# Patient Record
Sex: Female | Born: 2000 | Race: White | Hispanic: No | Marital: Single | Smoking: Never smoker
Health system: Southern US, Community
[De-identification: ages and names within clinical notes are randomized; demographics above are authoritative.]

## PROBLEM LIST (undated history)

## (undated) DIAGNOSIS — N946 Dysmenorrhea, unspecified: Secondary | ICD-10-CM

## (undated) HISTORY — PX: WISDOM TOOTH EXTRACTION: SHX21

## (undated) HISTORY — DX: Dysmenorrhea, unspecified: N94.6

---
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
## 2019-02-15 ENCOUNTER — Encounter: Payer: Self-pay | Admitting: Obstetrics and Gynecology

## 2019-04-11 ENCOUNTER — Telehealth: Payer: Self-pay | Admitting: Obstetrics and Gynecology

## 2019-04-11 ENCOUNTER — Other Ambulatory Visit: Payer: Self-pay

## 2019-04-11 ENCOUNTER — Encounter: Payer: Self-pay | Admitting: Obstetrics and Gynecology

## 2019-04-11 ENCOUNTER — Ambulatory Visit: Payer: BC Managed Care – PPO | Admitting: Obstetrics and Gynecology

## 2019-04-11 VITALS — BP 110/60 | HR 76 | Temp 98.1°F | Resp 16 | Ht 61.0 in | Wt 115.0 lb

## 2019-04-11 DIAGNOSIS — N946 Dysmenorrhea, unspecified: Secondary | ICD-10-CM | POA: Diagnosis not present

## 2019-04-11 MED ORDER — DROSPIRENONE-ETHINYL ESTRADIOL 3-0.03 MG PO TABS: 1.0000 | ORAL_TABLET | Freq: Every day | ORAL | 0 refills | Status: DC

## 2019-04-11 MED ORDER — IBUPROFEN 800 MG PO TABS: 800.0000 mg | ORAL_TABLET | Freq: Three times a day (TID) | ORAL | 5 refills | Status: AC | PRN

## 2019-04-11 NOTE — Telephone Encounter (Signed)
Patient needs a 3 month pill check appointment with Dr.Silva. She asked if she could have a  virtual appointment for this follow up?

## 2019-04-11 NOTE — Patient Instructions (Signed)
Drospirenone; Ethinyl Estradiol tablets What is this medicine? DROSPIRENONE; ETHINYL ESTRADIOL (dro SPY re nown; ETH in il es tra DYE ole) is an oral contraceptive (birth control pill). This medicine combines two types of female hormones, an estrogen and a progestin. It is used to prevent ovulation and pregnancy. This medicine may be used for other purposes; ask your health care provider or pharmacist if you have questions. COMMON BRAND NAME(S): Gianvi, Jasmiel, Lo-Zumandimine, Loryna, Nikki 28-Day, Ocella, Syeda, Vestura, Yasmin, Yaz, Zarah, Zumandimine What should I tell my health care provider before I take this medicine? They need to know if you have or ever had any of these conditions:  abnormal vaginal bleeding  adrenal gland disease  blood vessel disease or blood clots  breast, cervical, endometrial, ovarian, liver, or uterine cancer  diabetes  gallbladder disease  heart disease or recent heart attack  high blood pressure  high cholesterol  high potassium level  kidney disease  liver disease  migraine headaches  stroke  systemic lupus erythematosus (SLE)  tobacco smoker  an unusual or allergic reaction to estrogens, progestins, or other medicines, foods, dyes, or preservatives  pregnant or trying to get pregnant  breast-feeding How should I use this medicine? Take this medicine by mouth. To reduce nausea, this medicine may be taken with food. Follow the directions on the prescription label. Take this medicine at the same time each day and in the order directed on the package. Do not take your medicine more often than directed. A patient package insert for the product will be given with each prescription and refill. Read this sheet carefully each time. The sheet may change frequently. Talk to your pediatrician regarding the use of this medicine in children. Special care may be needed. This medicine has been used in female children who have started having  menstrual periods. Overdosage: If you think you have taken too much of this medicine contact a poison control center or emergency room at once. NOTE: This medicine is only for you. Do not share this medicine with others. What if I miss a dose? If you miss a dose, refer to the patient information sheet you received with your medicine for direction. If you miss more than one pill, this medicine may not be as effective and you may need to use another form of birth control. What may interact with this medicine? Do not take this medicine with any of the following medications:  aminoglutethimide  amprenavir, fosamprenavir  atazanavir; cobicistat  anastrozole  bosentan  exemestane  letrozole  metyrapone  testolactone This medicine may also interact with the following medications:  acetaminophen  antiviral medicines for HIV or AIDS  aprepitant  barbiturates  certain antibiotics like rifampin, rifabutin, rifapentine, and possibly penicillins or tetracyclines  certain diuretics like amiloride, spironolactone, triamterene  certain medicines for fungal infections like griseofulvin, ketoconazole, itraconazole  certain medications for high blood pressure or heart conditions like ACE-inhibitors, Angiotensin-II receptor blockers, eplerenone  certain medicines for seizures like carbamazepine, oxcarbazepine, phenobarbital, phenytoin  cholestyramine  cobicistat  corticosteroid like hydrocortisone and prednisolone  cyclosporine  dantrolene  felbamate  grapefruit juice  heparin  lamotrigine  medicines for diabetes, including pioglitazone  modafinil  NSAIDs  potassium supplements  pyrimethamine  raloxifene  St. John's wort  sulfasalazine  tamoxifen  topiramate  thyroid hormones  warfarin his list may not describe all possible interactions. Give your health care provider a list of all the medicines, herbs, non-prescription drugs, or dietary supplements  you use. Also tell them   if you smoke, drink alcohol, or use illegal drugs. Some items may interact with your medicine. This list may not describe all possible interactions. Give your health care provider a list of all the medicines, herbs, non-prescription drugs, or dietary supplements you use. Also tell them if you smoke, drink alcohol, or use illegal drugs. Some items may interact with your medicine. What should I watch for while using this medicine? Visit your doctor or health care professional for regular checks on your progress. You will need a regular breast and pelvic exam and Pap smear while on this medicine. Use an additional method of contraception during the first cycle that you take these tablets. If you have any reason to think you are pregnant, stop taking this medicine right away and contact your doctor or health care professional. If you are taking this medicine for hormone related problems, it may take several cycles of use to see improvement in your condition. Smoking increases the risk of getting a blood clot or having a stroke while you are taking birth control pills, especially if you are more than 19 years old. You are strongly advised not to smoke. This medicine can make your body retain fluid, making your fingers, hands, or ankles swell. Your blood pressure can go up. Contact your doctor or health care professional if you feel you are retaining fluid. This medicine can make you more sensitive to the sun. Keep out of the sun. If you cannot avoid being in the sun, wear protective clothing and use sunscreen. Do not use sun lamps or tanning beds/booths. If you wear contact lenses and notice visual changes, or if the lenses begin to feel uncomfortable, consult your eye care specialist. In some women, tenderness, swelling, or minor bleeding of the gums may occur. Notify your dentist if this happens. Brushing and flossing your teeth regularly may help limit this. See your dentist  regularly and inform your dentist of the medicines you are taking. If you are going to have elective surgery, you may need to stop taking this medicine before the surgery. Consult your health care professional for advice. This medicine does not protect you against HIV infection (AIDS) or any other sexually transmitted diseases. What side effects may I notice from receiving this medicine? Side effects that you should report to your doctor or health care professional as soon as possible:  allergic reactions like skin rash, itching or hives, swelling of the face, lips, or tongue  breast tissue changes or discharge  changes in vision  chest pain  confusion, trouble speaking or understanding  dark urine  general ill feeling or flu-like symptoms  light-colored stools  nausea, vomiting  pain, swelling, warmth in the leg  right upper belly pain  severe headaches  shortness of breath  sudden numbness or weakness of the face, arm or leg  trouble walking, dizziness, loss of balance or coordination  unusual vaginal bleeding  yellowing of the eyes or skin Side effects that usually do not require medical attention (report to your doctor or health care professional if they continue or are bothersome):  acne  brown spots on the face  change in appetite  change in sexual desire  depressed mood or mood swings  fluid retention and swelling  stomach cramps or bloating  unusually weak or tired  weight gain This list may not describe all possible side effects. Call your doctor for medical advice about side effects. You may report side effects to FDA at 1-800-FDA-1088. Where should I  keep my medicine? Keep out of the reach of children. Store at room temperature between 15 and 30 degrees C (59 and 86 degrees F). Throw away any unused medicine after the expiration date. NOTE: This sheet is a summary. It may not cover all possible information. If you have questions about this  medicine, talk to your doctor, pharmacist, or health care provider.  2020 Elsevier/Gold Standard (2016-05-08 13:52:56) Dysmenorrhea  Dysmenorrhea refers to cramps caused by the muscles of the uterus tightening (contracting) during a menstrual period. Dysmenorrhea may be mild, or it may be severe enough to interfere with everyday activities for a few days each month. Primary dysmenorrhea is menstrual cramps that last a couple of days when you start having menstrual periods or soon after. This often begins after a teenager starts having her period. As a woman gets older or has a baby, the cramps will usually lessen or disappear. Secondary dysmenorrhea begins later in life and is caused by a disorder in the reproductive system. It lasts longer, and it may cause more pain than primary dysmenorrhea. The pain may start before the period and last a few days after the period. What are the causes? Dysmenorrhea is usually caused by an underlying problem, such as:  The tissue that lines the uterus (endometrium) growing outside of the uterus in other areas of the body (endometriosis).  Endometrial tissue growing into the muscular walls of the uterus (adenomyosis).  Blood vessels in the pelvis becoming filled with blood just before the menstrual period (pelvic congestive syndrome).  Overgrowth of cells (polyps) in the endometrium or the lower part of the uterus (cervix).  The uterus dropping down into the vagina (prolapse) due to stretched or weak muscles.  Bladder problems, such as infection or inflammation.  Intestinal problems, such as a tumor or irritable bowel syndrome.  Cancer of the reproductive organs or bladder.  A severely tipped uterus.  A cervix that is closed or has a very small opening.  Noncancerous (benign) tumors of the uterus (fibroids).  Pelvic inflammatory disease (PID).  Pelvic scarring (adhesions) from a previous surgery.  An ovarian cyst.  An IUD (intrauterine device).  What increases the risk? You are more likely to develop this condition if:  You are younger than age 19.  You started puberty early.  You have irregular or heavy bleeding.  You have never given birth.  You have a family history of dysmenorrhea.  You smoke. What are the signs or symptoms? Symptoms of this condition include:  Cramping, throbbing pain, or a feeling of fullness in the lower abdomen.  Lower back pain.  Periods lasting for longer than 7 days.  Headaches.  Bloating.  Fatigue.  Nausea or vomiting.  Diarrhea.  Sweating or dizziness.  Loose stools. How is this diagnosed? This condition may be diagnosed based on:  Your symptoms.  Your medical history.  A physical exam.  Blood tests.  A Pap test. This is a test in which cells from the cervix are tested for signs of cancer or infection.  A pregnancy test.  Imaging tests, such as: ? Ultrasound. ? A procedure to remove and examine a sample of endometrial tissue (dilation and curettage, D&C). ? A procedure to visually examine the inside of:  The uterus (hysteroscopy).  The abdomen or pelvis (laparoscopy).  The bladder (cystoscopy).  The intestine (colonoscopy).  The stomach (gastroscopy). ? X-rays. ? CT scan. ? MRI. How is this treated? Treatment depends on the cause of the dysmenorrhea. Treatment may include:  Pain medicine prescribed by your health care provider.  Birth control pills that contain the hormone progesterone.  An IUD that contains the hormone progesterone.  Medicines to control bleeding.  Hormone replacement therapy.  NSAIDs. These may help to stop the production of hormones that cause cramps.  Antidepressant medicines.  Surgery to remove adhesions, endometriosis, ovarian cysts, fibroids, or the entire uterus (hysterectomy).  Injections of progesterone to stop the menstrual period.  A procedure to destroy the endometrium (endometrial ablation).  A procedure to  cut the nerves in the bottom of the spine (sacrum) that go to the reproductive organs (presacral neurectomy).  A procedure to apply an electric current to nerves in the sacrum (sacral nerve stimulation).  Exercise and physical therapy.  Meditation and yoga therapy.  Acupuncture. Work with your health care provider to determine what treatment or combination of treatments is best for you. Follow these instructions at home: Relieving pain and cramping  Apply heat to your lower back or abdomen when you experience pain or cramps. Use the heat source that your health care provider recommends, such as a moist heat pack or a heating pad. ? Place a towel between your skin and the heat source. ? Leave the heat on for 20-30 minutes. ? Remove the heat if your skin turns bright red. This is especially important if you are unable to feel pain, heat, or cold. You may have a greater risk of getting burned. ? Do not sleep with a heating pad on.  Do aerobic exercises, such as walking, swimming, or biking. This can help to relieve cramps.  Massage your lower back or abdomen to help relieve pain. General instructions  Take over-the-counter and prescription medicines only as told by your health care provider.  Do not drive or use heavy machinery while taking prescription pain medicine.  Avoid alcohol and caffeine during and right before your menstrual period. These can make cramps worse.  Do not use any products that contain nicotine or tobacco, such as cigarettes and e-cigarettes. If you need help quitting, ask your health care provider.  Keep all follow-up visits as told by your health care provider. This is important. Contact a health care provider if:  You have pain that gets worse or does not get better with medicine.  You have pain with sex.  You develop nausea or vomiting with your period that is not controlled with medicine. Get help right away if:  You faint. Summary  Dysmenorrhea  refers to cramps caused by the muscles of the uterus tightening (contracting) during a menstrual period.  Dysmenorrhea may be mild, or it may be severe enough to interfere with everyday activities for a few days each month.  Treatment depends on the cause of the dysmenorrhea.  Work with your health care provider to determine what treatment or combination of treatments is best for you. This information is not intended to replace advice given to you by your health care provider. Make sure you discuss any questions you have with your health care provider. Document Released: 08/17/2005 Document Revised: 07/30/2017 Document Reviewed: 09/19/2016 Elsevier Patient Education  2020 [employer].

## 2019-04-11 NOTE — Progress Notes (Signed)
18 y.o. G0P0000 Single Caucasian female here for dysmenorrhea.   May want to start on birth control to help with dysmenorrhea. She has progressively worse cramping with her cycles and has back pain prior to menses and during.  This is difficulty for her with her swimming. She is unable to work out at all.  Ibuprofen makes her period manageable.  Takes one or two at a time.  Heavy for the first day, bled through tampon in 2 hours for one of her menses during the beginning of the pandemic.   Denies blood in the stool or in the urine.   Not sexually active ever.   She states she has periods of experiencing the room spinning twice yearly.  This occurred at a swim meet and she had not eaten.  This occurred again and she had a panic attack the night before.  These are not seizures per patient.   Denies dx of migraine headaches, HTN, liver disease, breast disease, thromboembolic events of self or family.   She deals with some acne.   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  Tested negative for Covid 19 twice.  Going [place] [employer] in [place] for college.  She is on the swim team.   PCP:  none  Patient's last menstrual period was 03/17/2019 (exact date).           Sexually active: No.  The current method of family planning is abstinence.    Exercising: Yes.    swimming Smoker:  no  Health Maintenance: Pap:  never History of abnormal Pap:  N/A MMG:  n/a Colonoscopy:  n/a BMD:   n/a  Result  n/a TDaP: 2013 Gardasil:   yes HIV:no Hep C:no Screening Labs:  ---  reports that she has never smoked. She has never used smokeless tobacco. She reports that she does not drink alcohol or use drugs.  Past Medical History:  Diagnosis Date  . Dysmenorrhea     Past Surgical History:  Procedure Laterality Date  . WISDOM TOOTH EXTRACTION      Current Outpatient Medications  Medication Sig Dispense Refill  . Adapalene 0.3 % gel Apply 1 application topically daily.    . clindamycin (CLEOCIN T) 1  % lotion Apply 1 application topically daily.    . hydroquinone 4 % cream Apply 1 application topically daily.    Marland Kitchen. ibuprofen (ADVIL,MOTRIN) 200 MG tablet Take 200 mg by mouth every 8 (eight) hours as needed.     No current facility-administered medications for this visit.     History reviewed. No pertinent family history.  Review of Systems  All other systems reviewed and are negative.   Exam:   BP 110/60   Pulse 76   Temp 98.1 F (36.7 C) (Temporal)   Resp 16   Ht 5\' 1"  (1.549 m)   Wt 115 lb (52.2 kg)   LMP 03/17/2019 (Exact Date)   BMI 21.73 kg/m     General appearance: alert, cooperative and appears stated age Head: normocephalic, without obvious abnormality, atraumatic Neck: no adenopathy, supple, symmetrical, trachea midline and thyroid normal to inspection and palpation Lungs: clear to auscultation bilaterally Heart: regular rate and rhythm Abdomen: soft, non-tender; no masses, no organomegaly Extremities: extremities normal, atraumatic, no cyanosis or edema Skin: skin color, texture, turgor normal. No rashes or lesions Neurologic: grossly normal  Pelvic: Deferred.   Chaperone was present for exam.  Assessment:   Dysmenorrhea.  Acne.   Plan: We discussed painful menses and potential etiologies  including endometriosis.  Start Yasmin.  3 packs.   She will take continuous contraception and withdraw every 3 months.  Benefits reviewed.  We discussed warning signs and risk of stroke, DVT, PE, and MI.  Motrin 800 mg po q 8 hours prn. Pelvic ultrasound if symptoms do not improve. FU 3 months.  Can be a virtual visit.   Follow up annually and prn.  After visit summary provided.

## 2019-04-11 NOTE — Telephone Encounter (Signed)
Left message to call Sharee Pimple, RN at [facility].   73mo f/u with Dr. Quincy Simmonds can be OV or Virtual

## 2019-04-13 NOTE — Telephone Encounter (Signed)
Spoke with patient, advised she will need to activate MyChart in order to schedule virtual OV. New activation code sent via text to patient, patient will set up MyChart and then return call to schedule a virtual OV for the beginning of November 2020 for her 3 month pill check. Questions answered. Patient thankful for call.   Encounter closed.

## 2019-04-13 NOTE — Telephone Encounter (Signed)
Left message to call Kailyn Vanderslice, RN at [facility] [phone].   

## 2019-06-28 ENCOUNTER — Other Ambulatory Visit: Payer: Self-pay | Admitting: Obstetrics and Gynecology

## 2019-06-28 DIAGNOSIS — N946 Dysmenorrhea, unspecified: Secondary | ICD-10-CM

## 2019-06-28 NOTE — Telephone Encounter (Signed)
Left message to triage nurse at [facility].

## 2019-06-28 NOTE — Telephone Encounter (Signed)
Please schedule a birth control pill recheck appointment with me.  She will need a blood pressure check at this visit also. I will give one month refill to give patient time to complete this visit.

## 2019-06-28 NOTE — Telephone Encounter (Signed)
Medication refill request: yasmin continuous Last OV:  04-11-2019 Next AEX: not scheduled Last MMG (if hormonal medication request): none Refill authorized: At appt given 3packs with 0 refills. Please approve if appropriate

## 2019-06-30 NOTE — Telephone Encounter (Signed)
Spoke with pts mother per DPR. Pt currently at [facility] in 14 day Quarantine due to Covid exposure. Will not be able to make AEX until Dec 21 when pt can come home.   Will send in 1 month refill to pharmacy on file to give pt time to schedule AEX and BP check visit. Pt to call to schedule.   Routing to Dr Quincy Simmonds for review and recommendations.

## 2019-07-03 ENCOUNTER — Other Ambulatory Visit: Payer: Self-pay | Admitting: Obstetrics and Gynecology

## 2019-07-03 DIAGNOSIS — N946 Dysmenorrhea, unspecified: Secondary | ICD-10-CM

## 2019-07-03 MED ORDER — DROSPIRENONE-ETHINYL ESTRADIOL 3-0.03 MG PO TABS: 1.0000 | ORAL_TABLET | Freq: Every day | ORAL | 0 refills | Status: DC

## 2019-07-26 ENCOUNTER — Other Ambulatory Visit: Payer: Self-pay | Admitting: Obstetrics and Gynecology

## 2019-07-26 DIAGNOSIS — N946 Dysmenorrhea, unspecified: Secondary | ICD-10-CM

## 2019-07-26 NOTE — Telephone Encounter (Signed)
Medication refill request: Angelica Castro Last OV:  04/11/19 BS Next AEX: not scheduled; per last refill encounter 06/28/19 patient unable to schedule AEX until 08/21/19 Last MMG (if hormonal medication request): n/a Refill authorized: Please advise; Order pended #28 w/1 refills if authorized

## 2019-08-26 ENCOUNTER — Other Ambulatory Visit: Payer: Self-pay | Admitting: Obstetrics and Gynecology

## 2019-08-26 DIAGNOSIS — N946 Dysmenorrhea, unspecified: Secondary | ICD-10-CM

## 2019-08-28 NOTE — Telephone Encounter (Signed)
Medication refill request: yasmin Last visit: 04-11-2019 Next AEX: not scheduled Last MMG (if hormonal medication request): none Refill authorized: rx denied. She needs to schedule f/u visit. She has 1 refill left per chart

## 2019-09-14 ENCOUNTER — Other Ambulatory Visit: Payer: Self-pay | Admitting: Obstetrics and Gynecology

## 2019-09-14 DIAGNOSIS — N946 Dysmenorrhea, unspecified: Secondary | ICD-10-CM

## 2019-09-14 NOTE — Telephone Encounter (Signed)
Patient needs follow up appointment with Dr.Silva before further refills on OCP. Called patient and left message to call Marchelle Folks, CMA.

## 2019-09-26 NOTE — Telephone Encounter (Signed)
Spoke with pts mother Irving Burton ok per DPR. Mother states is off at college and wont be here until summer break. Mother states pt is currently not taking Yasmin OCPs. Encouraged pt to make web visit and/or go to student health for a BP check if wants to continue OCPs. Mother declines at this time. Pt will call in June or sooner to make AEX for August. Mother agreeable.   Will route to Dr Edward Jolly for review and will close encounter. Rx for Yasmin refused.

## 2021-12-03 NOTE — Progress Notes (Signed)
GYNECOLOGY  VISIT ?  ?HPI: ?21 y.o.   Single  Caucasian  female   ?G0P0000 with Patient's last menstrual period was 12/02/2021.   ?here for extreme pelvic pain at menses onset--lasted 45 mins.approx.   ?Pain occurred this week.  ?Woke up feeling sweaty, dizzy, and in pain.  ?Wonders if she had a cyst rupture or if she has endometriosis.  ?Denies dysuria.  ?Normal bowel function.  ? ?Feels like her hormones are out of whack.  ?Crying more easily.  ?Broke up with boyfriend recently. ? ?Menses are regular.  ?Heavy first 2 days and last for 5 - 7 days.  ?Tampon change 3 - 4 times a day.  ?No bleeding in between cycles.  ?Her periods are progressively more painful.  ?Cramping for a few days prior to her cycle and then for the first 2 - 3 days.  ?Takes Ibuprofen 800 mg which helps a little bit.  ?Swimming also helps.  ? ?Hx dysmenorrhea.  ?Received an Rx for Yasmin in 2020.  ?Took for a month or two and stopped due to emotional changes.  ?She did not notice a difference and did not need pregnancy prevention.  ? ?GYNECOLOGIC HISTORY: ? ?Contraception: None virgin ?Menopausal hormone therapy:  N/A ?Last mammogram:  N/A ?Last pap smear:   Never ?       ?OB History   ? ? Gravida  ?0  ? Para  ?0  ? Term  ?0  ? Preterm  ?0  ? AB  ?0  ? Living  ?0  ?  ? ? SAB  ?0  ? IAB  ?0  ? Ectopic  ?0  ? Multiple  ?0  ? Live Births  ?0  ?   ?  ?  ?    ? ?Patient Active Problem List  ? Diagnosis Date Noted  ? Left Rotator cuff tendonitis 05/17/2014  ? ? ?Past Medical History:  ?Diagnosis Date  ? Dysmenorrhea   ? ? ?Past Surgical History:  ?Procedure Laterality Date  ? WISDOM TOOTH EXTRACTION    ? ? ?Current Outpatient Medications  ?Medication Sig Dispense Refill  ? Adapalene 0.3 % gel Apply 1 application topically daily.    ? clindamycin (CLEOCIN T) 1 % lotion Apply 1 application topically daily.    ? hydroquinone 4 % cream Apply 1 application topically daily.    ? ibuprofen (ADVIL) 800 MG tablet Take 1 tablet (800 mg total) by mouth every 8  (eight) hours as needed. 30 tablet 5  ? ?No current facility-administered medications for this visit.  ?  ? ?ALLERGIES: Patient has no known allergies. ? ?No family history on file. ? ?Social History  ? ?Socioeconomic History  ? Marital status: Single  ?  Spouse name: Not on file  ? Number of children: Not on file  ? Years of education: Not on file  ? Highest education level: Not on file  ?Occupational History  ? Not on file  ?Tobacco Use  ? Smoking status: Never  ? Smokeless tobacco: Never  ?Vaping Use  ? Vaping Use: Never used  ?Substance and Sexual Activity  ? Alcohol use: Yes  ?  Comment: Occas.  ? Drug use: Never  ? Sexual activity: Never  ?  Comment: Virgin  ?Other Topics Concern  ? Not on file  ?Social History Narrative  ? Not on file  ? ?Social Determinants of Health  ? ?Financial Resource Strain: Not on file  ?Food Insecurity: Not on file  ?Transportation Needs:  Not on file  ?Physical Activity: Not on file  ?Stress: Not on file  ?Social Connections: Not on file  ?Intimate Partner Violence: Not on file  ? ? ?Review of Systems  See HPI.  ? ?PHYSICAL EXAMINATION:   ? ?BP 116/72 (BP Location: Left Arm, Patient Position: Sitting, Cuff Size: Normal)   LMP 12/02/2021     ?General appearance: alert, cooperative and appears stated age ?Head: Normocephalic, without obvious abnormality, atraumatic ?Lungs: clear to auscultation bilaterally ?Heart: regular rate and rhythm ?Abdomen: soft, non-tender, no masses, no organomegaly ?Back:  negative CVA tenderness.  ?No abnormal inguinal nodes palpated ?  ?Pelvic:  Deferred.  ? ?ASSESSMENT ? ?Pelvic pain.  ?Dysmenorrhea.  ? ?PLAN ? ?Urine sg 1.010, ph 6.0, negative.  ?Transabdominal US now:  uterus normal shape and size.  EMS 7.4 mm. Normal ovaries with normal perfusion.  No adnexal masses.  No free fluid. ?Reassurance give regarding ultrasound findings.  ?Follow up for annual exam and prn. ?  ?An After Visit Summary was printed and given to the patient. ? ?32 min  total time  was spent for this patient encounter, including preparation, face-to-face counseling with the patient, coordination of care, and documentation of the encounter. ? ?  ?

## 2021-12-04 ENCOUNTER — Ambulatory Visit (INDEPENDENT_AMBULATORY_CARE_PROVIDER_SITE_OTHER): Payer: BC Managed Care – PPO

## 2021-12-04 ENCOUNTER — Encounter: Payer: Self-pay | Admitting: Obstetrics and Gynecology

## 2021-12-04 ENCOUNTER — Ambulatory Visit: Payer: BC Managed Care – PPO | Admitting: Obstetrics and Gynecology

## 2021-12-04 VITALS — BP 116/72

## 2021-12-04 DIAGNOSIS — N946 Dysmenorrhea, unspecified: Secondary | ICD-10-CM | POA: Diagnosis not present

## 2021-12-04 DIAGNOSIS — R102 Pelvic and perineal pain: Secondary | ICD-10-CM | POA: Diagnosis not present

## 2021-12-04 LAB — URINALYSIS, COMPLETE W/RFL CULTURE
Bacteria, UA: NONE SEEN /HPF
Bilirubin Urine: NEGATIVE
Glucose, UA: NEGATIVE
Hgb urine dipstick: NEGATIVE
Hyaline Cast: NONE SEEN /LPF
Ketones, ur: NEGATIVE
Leukocyte Esterase: NEGATIVE
Nitrites, Initial: NEGATIVE
Protein, ur: NEGATIVE
RBC / HPF: NONE SEEN /HPF (ref 0–2)
Specific Gravity, Urine: 1.01 (ref 1.001–1.035)
WBC, UA: NONE SEEN /HPF (ref 0–5)
pH: 6 (ref 5.0–8.0)

## 2021-12-04 LAB — NO CULTURE INDICATED

## 2021-12-08 ENCOUNTER — Telehealth: Payer: Self-pay

## 2021-12-08 NOTE — Telephone Encounter (Signed)
Angelica Castro  [facility] Triage ? 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 Was last seen 12/04/21 with Edward Jolly.  ? ?(Pt request letter be emailed to athletic trainer at:  ?[name]) ? ? ? ?Dr. Edward Jolly- okay to write this letter for her? ?

## 2021-12-09 NOTE — Telephone Encounter (Signed)
Patient called back and left message in triage voicemail stating the letter is for swimming. Patient said she needs clearance to swim and can't swim until unless a note is provided. ?

## 2021-12-09 NOTE — Telephone Encounter (Signed)
Patient may return to sports at school.  ? ?Ok to write letter, but this cannot be sent as an email due to HIPPA and health privacy. ? ?It will need to be mailed by [place] mail or sent as a My Chart letter to patient. ?

## 2021-12-10 NOTE — Telephone Encounter (Signed)
Letter sent to patient through My Chart. I called and spoke with patient and confirmed she received it. ?

## 2021-12-10 NOTE — Telephone Encounter (Signed)
Patient called in this morning to check status of letter of clearance. She would like to know if she can have it sent to her today through mychart.  ?

## 2022-01-19 ENCOUNTER — Emergency Department (HOSPITAL_BASED_OUTPATIENT_CLINIC_OR_DEPARTMENT_OTHER): Payer: BC Managed Care – PPO | Admitting: Radiology

## 2022-01-19 ENCOUNTER — Emergency Department (HOSPITAL_BASED_OUTPATIENT_CLINIC_OR_DEPARTMENT_OTHER)
Admission: EM | Admit: 2022-01-19 | Discharge: 2022-01-19 | Disposition: A | Discharge status: Home / Self Care | Payer: BC Managed Care – PPO | Attending: Emergency Medicine | Admitting: Emergency Medicine

## 2022-01-19 ENCOUNTER — Encounter (HOSPITAL_BASED_OUTPATIENT_CLINIC_OR_DEPARTMENT_OTHER): Payer: Self-pay

## 2022-01-19 ENCOUNTER — Other Ambulatory Visit: Payer: Self-pay

## 2022-01-19 DIAGNOSIS — M546 Pain in thoracic spine: Secondary | ICD-10-CM | POA: Diagnosis not present

## 2022-01-19 DIAGNOSIS — R102 Pelvic and perineal pain: Secondary | ICD-10-CM | POA: Diagnosis not present

## 2022-01-19 DIAGNOSIS — M79641 Pain in right hand: Secondary | ICD-10-CM | POA: Insufficient documentation

## 2022-01-19 DIAGNOSIS — R519 Headache, unspecified: Secondary | ICD-10-CM | POA: Diagnosis not present

## 2022-01-19 DIAGNOSIS — T148XXA Other injury of unspecified body region, initial encounter: Secondary | ICD-10-CM

## 2022-01-19 DIAGNOSIS — S70311A Abrasion, right thigh, initial encounter: Secondary | ICD-10-CM | POA: Diagnosis not present

## 2022-01-19 DIAGNOSIS — Y92410 Unspecified street and highway as the place of occurrence of the external cause: Secondary | ICD-10-CM | POA: Insufficient documentation

## 2022-01-19 DIAGNOSIS — R079 Chest pain, unspecified: Secondary | ICD-10-CM | POA: Diagnosis not present

## 2022-01-19 DIAGNOSIS — S8991XA Unspecified injury of right lower leg, initial encounter: Secondary | ICD-10-CM | POA: Diagnosis present

## 2022-01-19 DIAGNOSIS — S8002XA Contusion of left knee, initial encounter: Secondary | ICD-10-CM | POA: Diagnosis not present

## 2022-01-19 DIAGNOSIS — S8001XA Contusion of right knee, initial encounter: Secondary | ICD-10-CM | POA: Diagnosis not present

## 2022-01-19 DIAGNOSIS — M7918 Myalgia, other site: Secondary | ICD-10-CM

## 2022-01-19 DIAGNOSIS — S70312A Abrasion, left thigh, initial encounter: Secondary | ICD-10-CM | POA: Insufficient documentation

## 2022-01-19 LAB — PREGNANCY, URINE: Preg Test, Ur: NEGATIVE
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
## 2022-01-19 MED ORDER — IBUPROFEN 400 MG PO TABS
600.0000 mg | ORAL_TABLET | Freq: Once | ORAL | Status: AC
  Administered 2022-01-19: 600 mg via ORAL
  Filled 2022-01-19: qty 1
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
## 2022-01-19 NOTE — Discharge Instructions (Signed)
Your xrays were very reassuring today without any signs of fractures or dislocations. Please alternate Ibuprofen and Tylenol as needed for pain. Apply ice to the areas of soreness to help with pain.   You can also apply bacitracin ointment to your abrasions on your hips to help with wound healing.   Follow up with your PCP for further eval.   Return to the ED for any new/worsening symptoms

## 2024-06-28 ENCOUNTER — Encounter: Payer: Self-pay | Admitting: Radiology

## 2024-06-28 ENCOUNTER — Ambulatory Visit: Admitting: Radiology

## 2024-06-28 VITALS — BP 116/76 | HR 80 | Ht 61.5 in | Wt 117.0 lb

## 2024-06-28 DIAGNOSIS — Z3009 Encounter for other general counseling and advice on contraception: Secondary | ICD-10-CM

## 2024-06-28 DIAGNOSIS — Z30011 Encounter for initial prescription of contraceptive pills: Secondary | ICD-10-CM

## 2024-06-28 MED ORDER — MISOPROSTOL 200 MCG PO TABS: 400.0000 ug | ORAL_TABLET | Freq: Once | ORAL | 0 refills | Status: AC

## 2024-06-28 NOTE — Progress Notes (Signed)
   Angelica Castro [facility] 2000-09-04 [id number]   History:  23 y.o. G0 here to discuss BC options. Yet to have sexual debut but has a serious BF and they would like to become sexually active. Has never had pap.  Gynecologic History Patient's last menstrual period was 06/21/2024 (exact date). Period Cycle (Days): 28 Period Duration (Days): 4 Period Pattern: Regular Menstrual Flow: Moderate, Heavy Menstrual Control: Tampon Menstrual Control Change Freq (Hours): 2-3 Dysmenorrhea: (!) Mild Dysmenorrhea Symptoms: Cramping, Nausea, Diarrhea, Headache Contraception/Family planning: abstinence Sexually active: yes   Obstetric History OB History  Gravida Para Term Preterm AB Living  0 0 0 0 0 0  SAB IAB Ectopic Multiple Live Births  0 0 0 0 0       01/10/2015   11:23 AM 06/14/2014    8:31 AM  Depression screen PHQ 2/9  Decreased Interest 0 0  Down, Depressed, Hopeless 0 0  PHQ - 2 Score 0 0     The following portions of the patient's history were reviewed and updated as appropriate: allergies, current medications, past family history, past medical history, past social history, past surgical history, and problem list.  Review of Systems  All other systems reviewed and are negative.   Past medical history, past surgical history, family history and social history were all reviewed and documented in the EPIC chart.  Exam:  Vitals:   06/28/24 1056  BP: 116/76  Pulse: 80  SpO2: 95%  Weight: 117 lb (53.1 kg)  Height: 5' 1.5 (1.562 m)   Body mass index is 21.75 kg/m.  Physical Exam Vitals and nursing note reviewed. Exam conducted with a chaperone present.  Constitutional:      Appearance: Normal appearance. She is well-developed.  Pulmonary:     Effort: Pulmonary effort is normal.  Abdominal:     General: Abdomen is flat.     Palpations: Abdomen is soft.  Genitourinary:    General: Normal vulva.     Vagina: No vaginal discharge, erythema, bleeding or lesions.     Cervix:  Normal. No discharge, friability, lesion or erythema.     Uterus: Normal.      Adnexa: Right adnexa normal and left adnexa normal.  Neurological:     Mental Status: She is alert.  Psychiatric:        Mood and Affect: Mood normal.        Thought Content: Thought content normal.        Judgment: Judgment normal.      Dereck Keas, CMA present for exam  Assessment/Plan:   1. Counseling for birth control regarding intrauterine device (IUD) (Primary) Tolerated pelvic exam well, will proceed with Skyla IUD insertion. Will have her use cytotec before appt and Ibuprofen  800mg  2 hours before insertion - IUD Insertion; Future - misoprostol (CYTOTEC) 200 MCG tablet; Place 2 tablets (400 mcg total) vaginally once for 1 dose. The night before IUD insertion.  Dispense: 2 tablet; Refill: 0   Return for skyla insert ASAP.  GINETTE COZIER B WHNP-BC 11:17 AM 06/28/2024

## 2024-06-30 ENCOUNTER — Encounter: Payer: Self-pay | Admitting: Radiology

## 2024-06-30 ENCOUNTER — Ambulatory Visit (INDEPENDENT_AMBULATORY_CARE_PROVIDER_SITE_OTHER): Admitting: Radiology

## 2024-06-30 VITALS — BP 122/74 | HR 84 | Wt 116.0 lb

## 2024-06-30 DIAGNOSIS — Z30430 Encounter for insertion of intrauterine contraceptive device: Secondary | ICD-10-CM | POA: Diagnosis not present

## 2024-06-30 DIAGNOSIS — Z3009 Encounter for other general counseling and advice on contraception: Secondary | ICD-10-CM

## 2024-06-30 DIAGNOSIS — Z01812 Encounter for preprocedural laboratory examination: Secondary | ICD-10-CM

## 2024-06-30 LAB — PREGNANCY, URINE: Preg Test, Ur: NEGATIVE

## 2024-06-30 MED ORDER — LEVONORGESTREL 13.5 MG IU IUD: INTRAUTERINE_SYSTEM | Freq: Once | INTRAUTERINE | Status: AC

## 2024-06-30 NOTE — Progress Notes (Signed)
   Angelica Castro [facility] 2001/06/18 [id number]   History:  23 y.o. G0 presents for insertion of Skyla IUD.  Pt has been counseled about risks and benefits as well as complications.    Patient's last menstrual period was 06/21/2024 (exact date).    Past medical history, past surgical history, family history and social history were all reviewed and documented in the EPIC chart.  ROS:  A ROS was performed and pertinent positives and negatives are included.  Time out performed and informed consent received.  Exam: Vitals:   06/30/24 1201  BP: 122/74  Pulse: 84  SpO2: 99%  Weight: 116 lb (52.6 kg)   Body mass index is 21.56 kg/m.  Pelvic exam: Vulva:  normal female genitalia Vagina:  normal vagina, no discharge, exudate, lesion, or erythema Cervix:  Non-tender, Negative CMT, no lesions or redness. Uterus:  normal shape, position and consistency    Procedure:  Speculum inserted.   Cervix visualized and cleansed with Betadine x 3.  Tenaculum placed on anterior cervix. Then uterus sounded to 7 cm. IUD inserted easily. Strings trimmed to 3 cm.  Minimal bleeding noted.  Pt tolerated the procedure well. She did have some lightheadedness after procedure. Was given crackers and ginger ale and sat for . Mother came to drive her home.  Chaperone present: Angelica Castro, CMA   Assessment/Plan: 1. Encounter for IUD insertion (Primary) - Levonorgestrel IUD  2. Pre-procedure lab exam - Pregnancy, urine; negative  Return for recheck 4-6 weeks Pt aware to call for any concerns Pt aware removal due no later than 07/01/2027, IUD card given to pt.   Angelica Castro B WHNP-BC, 1:40 PM 06/30/2024

## 2024-07-31 NOTE — Telephone Encounter (Signed)
 Patient also left message on triage line.  Spoke with patient. Patient had intercourse over the weekend, condom also used. Skyla  IUD placed 06/30/24. Patient states she has never been able to feel strings, is concerned IUD could have come out.   Denies vaginal bleeding, pain, N/V, fever/chills.   Advised of other reasons she may not be able to feel strings, strings could be too short, curled or against the cervix making them difficult to feel. Advised to keep IUD check as scheduled with Jami on 08/15/24. If any new symptoms develop, return call to office. Advised I will review with covering provider, our office will return call if any additional recommendations. Patient verbalizes understanding and appreciative of call.   Routing to Dr. Glennon for final review.   Cc: Jami

## 2024-08-15 ENCOUNTER — Encounter: Payer: Self-pay | Admitting: Radiology

## 2024-08-15 ENCOUNTER — Ambulatory Visit (INDEPENDENT_AMBULATORY_CARE_PROVIDER_SITE_OTHER): Admitting: Radiology

## 2024-08-15 VITALS — BP 122/68 | HR 98 | Wt 117.0 lb

## 2024-08-15 DIAGNOSIS — Z30431 Encounter for routine checking of intrauterine contraceptive device: Secondary | ICD-10-CM | POA: Diagnosis not present

## 2024-08-15 NOTE — Progress Notes (Signed)
° ° ° °  History:  23 y.o. G0P0000 here today for today for IUD string check; Skyla  IUD was placed  06/30/24. No complaints about the IUD, no concerning side effects.Unable to feel strings which caused some anxiety.  The following portions of the patient's history were reviewed and updated as appropriate: allergies, current medications, past family history, past medical history, past social history, past surgical history and problem list.  Review of Systems:  Pertinent items are noted in HPI.   Objective:  Physical Exam Blood pressure 122/68, pulse 98, weight 117 lb (53.1 kg), last menstrual period 08/12/2024, SpO2 98%. Gen: NAD Abd: Soft, nontender and nondistended Pelvic: Normal appearing external genitalia; normal appearing vaginal mucosa and cervix.  IUD strings visualized, about 2 cm in length outside cervix.   Darice Hoit, CMA present for exam  Assessment & Plan:  Normal IUD check. Patient may keep IUD in place for up to 3 years. May remover sooner  if she desires pregnancy, or has side effects within that time.   Aireana Ryland, WHNP

## 2024-09-14 ENCOUNTER — Other Ambulatory Visit (HOSPITAL_COMMUNITY)
Admission: RE | Admit: 2024-09-14 | Discharge: 2024-09-14 | Disposition: A | Source: Ambulatory Visit | Attending: Radiology | Admitting: Radiology

## 2024-09-14 ENCOUNTER — Encounter: Payer: Self-pay | Admitting: Radiology

## 2024-09-14 ENCOUNTER — Ambulatory Visit: Payer: Self-pay | Admitting: Radiology

## 2024-09-14 VITALS — BP 102/60 | HR 91 | Ht 62.0 in | Wt 116.0 lb

## 2024-09-14 DIAGNOSIS — Z1331 Encounter for screening for depression: Secondary | ICD-10-CM | POA: Diagnosis not present

## 2024-09-14 DIAGNOSIS — Z01419 Encounter for gynecological examination (general) (routine) without abnormal findings: Secondary | ICD-10-CM

## 2024-09-14 DIAGNOSIS — Z975 Presence of (intrauterine) contraceptive device: Secondary | ICD-10-CM

## 2024-09-14 NOTE — Progress Notes (Signed)
 "  Angelica Castro 07/27/2001 [id number]   History:  24 y.o. G0 presents for annual exam. Treated for UTI at urgent care Monday, symptoms are better.  Gynecologic History Patient's last menstrual period was 09/12/2024 (exact date). Period Cycle (Days): 28 Period Duration (Days): 5-7 Period Pattern: Regular Menstrual Flow: Heavy, Light Menstrual Control: Tampon Dysmenorrhea: (!) Moderate Dysmenorrhea Symptoms: Cramping Contraception/Family planning: IUD Sexually active: yes Last Pap: never  Obstetric History OB History  Gravida Para Term Preterm AB Living  0 0 0 0 0 0  SAB IAB Ectopic Multiple Live Births  0 0 0 0 0       09/14/2024    2:53 PM 01/10/2015   11:23 AM 06/14/2014    8:31 AM  Depression screen PHQ 2/9  Decreased Interest 0 0 0  Down, Depressed, Hopeless 0 0 0  PHQ - 2 Score 0 0 0     The following portions of the patient's history were reviewed and updated as appropriate: allergies, current medications, past family history, past medical history, past social history, past surgical history, and problem list.  Review of Systems  All other systems reviewed and are negative.   Past medical history, past surgical history, family history and social history were all reviewed and documented in the EPIC chart.  Exam:  Vitals:   09/14/24 1451  BP: 102/60  Pulse: 91  SpO2: 98%  Weight: 116 lb (52.6 kg)  Height: 5' 2 (1.575 m)   Body mass index is 21.22 kg/m.  Physical Exam Vitals and nursing note reviewed. Exam conducted with a chaperone present.  Constitutional:      Appearance: Normal appearance. She is normal weight.  HENT:     Head: Normocephalic and atraumatic.  Neck:     Thyroid: No thyroid mass, thyromegaly or thyroid tenderness.  Cardiovascular:     Rate and Rhythm: Regular rhythm.     Heart sounds: Normal heart sounds.  Pulmonary:     Effort: Pulmonary effort is normal.     Breath sounds: Normal breath sounds.  Chest:  Breasts:    Breasts  are symmetrical.     Right: Normal. No inverted nipple, mass, nipple discharge, skin change or tenderness.     Left: Normal. No inverted nipple, mass, nipple discharge, skin change or tenderness.  Abdominal:     General: Abdomen is flat. Bowel sounds are normal.     Palpations: Abdomen is soft.  Genitourinary:    General: Normal vulva.     Vagina: Normal. No vaginal discharge, bleeding or lesions.     Cervix: Normal. No discharge or lesion.     Uterus: Normal. Not enlarged and not tender.      Adnexa: Right adnexa normal and left adnexa normal.       Right: No mass, tenderness or fullness.         Left: No mass, tenderness or fullness.    Lymphadenopathy:     Upper Body:     Right upper body: No axillary adenopathy.     Left upper body: No axillary adenopathy.  Skin:    General: Skin is warm and dry.  Neurological:     Mental Status: She is alert and oriented to person, place, and time.  Psychiatric:        Mood and Affect: Mood normal.        Thought Content: Thought content normal.        Judgment: Judgment normal.      Darice Hoit, CMA present  for exam  Assessment/Plan:   1. Well woman exam with routine gynecological exam (Primary) - Cytology - PAP( [facility])  2. IUD (intrauterine device) in place  3. Depression screening      Return in about 1 year (around 09/14/2025) for Annual.  GINETTE COZIER B WHNP-BC 3:07 PM 09/14/2024 "

## 2024-09-14 NOTE — Patient Instructions (Signed)

## 2024-09-15 ENCOUNTER — Ambulatory Visit: Payer: Self-pay | Admitting: Radiology

## 2024-09-15 LAB — CYTOLOGY - PAP: Diagnosis: NEGATIVE

## 2025-09-19 ENCOUNTER — Ambulatory Visit: Admitting: Radiology
